# Patient Record
Sex: Male | Born: 1995 | Race: Black or African American | Hispanic: No | Marital: Single | Smoking: Never smoker
Health system: Southern US, Community
[De-identification: ages and names within clinical notes are randomized; demographics above are authoritative.]

## PROBLEM LIST (undated history)

## (undated) HISTORY — PX: HAND SURGERY: SHX662

---

## 2018-07-25 ENCOUNTER — Encounter: Payer: Self-pay | Admitting: Emergency Medicine

## 2018-07-25 ENCOUNTER — Emergency Department: Payer: BC Managed Care – PPO

## 2018-07-25 DIAGNOSIS — S199XXA Unspecified injury of neck, initial encounter: Secondary | ICD-10-CM | POA: Diagnosis present

## 2018-07-25 DIAGNOSIS — S161XXA Strain of muscle, fascia and tendon at neck level, initial encounter: Secondary | ICD-10-CM | POA: Insufficient documentation

## 2018-07-25 DIAGNOSIS — Y9389 Activity, other specified: Secondary | ICD-10-CM | POA: Diagnosis not present

## 2018-07-25 DIAGNOSIS — Y998 Other external cause status: Secondary | ICD-10-CM | POA: Insufficient documentation

## 2018-07-25 DIAGNOSIS — S20212A Contusion of left front wall of thorax, initial encounter: Secondary | ICD-10-CM | POA: Insufficient documentation

## 2018-07-25 DIAGNOSIS — Y92410 Unspecified street and highway as the place of occurrence of the external cause: Secondary | ICD-10-CM | POA: Diagnosis not present
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
## 2018-07-25 NOTE — ED Notes (Signed)
Patient transported to CT 

## 2018-07-26 ENCOUNTER — Emergency Department
Admission: EM | Admit: 2018-07-26 | Discharge: 2018-07-26 | Disposition: A | Discharge status: Home / Self Care | Payer: BC Managed Care – PPO | Attending: Emergency Medicine | Admitting: Emergency Medicine

## 2018-07-26 DIAGNOSIS — S20212A Contusion of left front wall of thorax, initial encounter: Secondary | ICD-10-CM

## 2018-07-26 DIAGNOSIS — S161XXA Strain of muscle, fascia and tendon at neck level, initial encounter: Secondary | ICD-10-CM

## 2018-07-26 MED ORDER — CYCLOBENZAPRINE HCL 10 MG PO TABS
5.0000 mg | ORAL_TABLET | Freq: Once | ORAL | Status: AC
  Administered 2018-07-26: 5 mg via ORAL
  Filled 2018-07-26: qty 1

## 2018-07-26 MED ORDER — IBUPROFEN 800 MG PO TABS
800.0000 mg | ORAL_TABLET | Freq: Once | ORAL | Status: AC
  Administered 2018-07-26: 800 mg via ORAL
  Filled 2018-07-26: qty 1

## 2018-07-26 MED ORDER — HYDROCODONE-ACETAMINOPHEN 5-325 MG PO TABS: 1.0000 | ORAL_TABLET | Freq: Four times a day (QID) | ORAL | 0 refills | Status: AC | PRN

## 2018-07-26 MED ORDER — HYDROCODONE-ACETAMINOPHEN 5-325 MG PO TABS
1.0000 | ORAL_TABLET | Freq: Once | ORAL | Status: AC
  Administered 2018-07-26: 1 via ORAL
  Filled 2018-07-26: qty 1

## 2018-07-26 MED ORDER — IBUPROFEN 800 MG PO TABS: 800.0000 mg | ORAL_TABLET | Freq: Three times a day (TID) | ORAL | 0 refills | Status: AC | PRN

## 2018-07-26 MED ORDER — CYCLOBENZAPRINE HCL 5 MG PO TABS: ORAL_TABLET | ORAL | 0 refills | Status: AC
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
## 2018-07-26 NOTE — Discharge Instructions (Addendum)
1.  You may take medicines as needed for pain and muscle spasms  (Motrin/Norco/Flexeril #15). 2.  Use incentive spirometer as instructed. 3.  Return to the ER for worsening symptoms, persistent vomiting, difficulty breathing or other concerns.

## 2018-07-26 NOTE — ED Notes (Signed)
Patient to stat desk in no acute distress asking about wait time. Patient given update on wait time.  
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
## 2023-02-03 ENCOUNTER — Encounter: Payer: Self-pay | Admitting: Family

## 2023-04-16 ENCOUNTER — Encounter: Payer: BLUE CROSS/BLUE SHIELD | Admitting: Family

## 2023-04-16 NOTE — Progress Notes (Signed)
Erroneous encounter-disregard

## 2024-07-22 ENCOUNTER — Telehealth: Payer: Self-pay | Admitting: Internal Medicine

## 2024-07-22 ENCOUNTER — Ambulatory Visit: Admitting: Physician Assistant

## 2024-07-22 NOTE — Telephone Encounter (Signed)
 07/22/24 new patient no show. Dismissed-Toni
